# Patient Record
Sex: Female | Born: 2000 | Race: White | Hispanic: No | Marital: Single | Smoking: Never smoker
Health system: Southern US, Community
[De-identification: ages and names within clinical notes are randomized; demographics above are authoritative.]

## PROBLEM LIST (undated history)

## (undated) ENCOUNTER — Emergency Department (HOSPITAL_BASED_OUTPATIENT_CLINIC_OR_DEPARTMENT_OTHER): Disposition: A | Discharge status: Home / Self Care | Payer: No Typology Code available for payment source

## (undated) DIAGNOSIS — E559 Vitamin D deficiency, unspecified: Secondary | ICD-10-CM

## (undated) DIAGNOSIS — Z62810 Personal history of physical and sexual abuse in childhood: Secondary | ICD-10-CM

## (undated) DIAGNOSIS — F419 Anxiety disorder, unspecified: Secondary | ICD-10-CM

## (undated) HISTORY — PX: BUNIONECTOMY: SHX129

## (undated) HISTORY — DX: Personal history of physical and sexual abuse in childhood: Z62.810

## (undated) HISTORY — PX: ADENOIDECTOMY: SUR15

## (undated) HISTORY — PX: TONSILLECTOMY AND ADENOIDECTOMY: SUR1326

## (undated) HISTORY — DX: Anxiety disorder, unspecified: F41.9

## (undated) HISTORY — DX: Vitamin D deficiency, unspecified: E55.9

---
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
## 2020-01-07 DIAGNOSIS — F411 Generalized anxiety disorder: Secondary | ICD-10-CM | POA: Diagnosis not present

## 2020-02-15 DIAGNOSIS — F411 Generalized anxiety disorder: Secondary | ICD-10-CM | POA: Diagnosis not present

## 2020-02-18 DIAGNOSIS — F419 Anxiety disorder, unspecified: Secondary | ICD-10-CM | POA: Diagnosis not present

## 2020-02-18 DIAGNOSIS — F900 Attention-deficit hyperactivity disorder, predominantly inattentive type: Secondary | ICD-10-CM | POA: Diagnosis not present

## 2020-02-23 DIAGNOSIS — F411 Generalized anxiety disorder: Secondary | ICD-10-CM | POA: Diagnosis not present

## 2020-03-14 DIAGNOSIS — F419 Anxiety disorder, unspecified: Secondary | ICD-10-CM | POA: Diagnosis not present

## 2020-03-14 DIAGNOSIS — F900 Attention-deficit hyperactivity disorder, predominantly inattentive type: Secondary | ICD-10-CM | POA: Diagnosis not present

## 2020-03-15 DIAGNOSIS — F331 Major depressive disorder, recurrent, moderate: Secondary | ICD-10-CM | POA: Diagnosis not present

## 2020-03-22 DIAGNOSIS — F4323 Adjustment disorder with mixed anxiety and depressed mood: Secondary | ICD-10-CM | POA: Diagnosis not present

## 2020-05-03 DIAGNOSIS — F331 Major depressive disorder, recurrent, moderate: Secondary | ICD-10-CM | POA: Diagnosis not present

## 2020-07-08 DIAGNOSIS — F419 Anxiety disorder, unspecified: Secondary | ICD-10-CM | POA: Diagnosis not present

## 2020-07-08 DIAGNOSIS — F900 Attention-deficit hyperactivity disorder, predominantly inattentive type: Secondary | ICD-10-CM | POA: Diagnosis not present

## 2021-07-06 DIAGNOSIS — Z419 Encounter for procedure for purposes other than remedying health state, unspecified: Secondary | ICD-10-CM | POA: Diagnosis not present

## 2021-08-06 DIAGNOSIS — Z419 Encounter for procedure for purposes other than remedying health state, unspecified: Secondary | ICD-10-CM | POA: Diagnosis not present

## 2021-09-06 DIAGNOSIS — Z419 Encounter for procedure for purposes other than remedying health state, unspecified: Secondary | ICD-10-CM | POA: Diagnosis not present

## 2021-10-04 DIAGNOSIS — Z419 Encounter for procedure for purposes other than remedying health state, unspecified: Secondary | ICD-10-CM | POA: Diagnosis not present

## 2021-11-04 DIAGNOSIS — Z419 Encounter for procedure for purposes other than remedying health state, unspecified: Secondary | ICD-10-CM | POA: Diagnosis not present

## 2021-12-04 DIAGNOSIS — Z419 Encounter for procedure for purposes other than remedying health state, unspecified: Secondary | ICD-10-CM | POA: Diagnosis not present

## 2022-01-04 DIAGNOSIS — Z419 Encounter for procedure for purposes other than remedying health state, unspecified: Secondary | ICD-10-CM | POA: Diagnosis not present

## 2022-02-03 DIAGNOSIS — Z419 Encounter for procedure for purposes other than remedying health state, unspecified: Secondary | ICD-10-CM | POA: Diagnosis not present

## 2022-03-06 DIAGNOSIS — Z419 Encounter for procedure for purposes other than remedying health state, unspecified: Secondary | ICD-10-CM | POA: Diagnosis not present

## 2022-04-06 DIAGNOSIS — Z419 Encounter for procedure for purposes other than remedying health state, unspecified: Secondary | ICD-10-CM | POA: Diagnosis not present

## 2022-04-23 ENCOUNTER — Encounter: Payer: Self-pay | Admitting: Obstetrics and Gynecology

## 2022-04-23 ENCOUNTER — Ambulatory Visit (INDEPENDENT_AMBULATORY_CARE_PROVIDER_SITE_OTHER): Payer: Medicaid Other | Admitting: Obstetrics and Gynecology

## 2022-04-23 ENCOUNTER — Other Ambulatory Visit (HOSPITAL_COMMUNITY)
Admission: RE | Admit: 2022-04-23 | Discharge: 2022-04-23 | Disposition: A | Discharge status: Home / Self Care | Payer: Medicaid Other | Source: Ambulatory Visit | Attending: Obstetrics and Gynecology | Admitting: Obstetrics and Gynecology

## 2022-04-23 VITALS — BP 133/84 | HR 91 | Ht 67.0 in | Wt 302.0 lb

## 2022-04-23 DIAGNOSIS — Z3046 Encounter for surveillance of implantable subdermal contraceptive: Secondary | ICD-10-CM | POA: Diagnosis not present

## 2022-04-23 DIAGNOSIS — Z01419 Encounter for gynecological examination (general) (routine) without abnormal findings: Secondary | ICD-10-CM | POA: Diagnosis not present

## 2022-04-23 DIAGNOSIS — Z6841 Body Mass Index (BMI) 40.0 and over, adult: Secondary | ICD-10-CM | POA: Diagnosis not present

## 2022-04-23 DIAGNOSIS — Z30430 Encounter for insertion of intrauterine contraceptive device: Secondary | ICD-10-CM | POA: Diagnosis not present

## 2022-04-23 MED ORDER — LEVONORGESTREL 20 MCG/DAY IU IUD
1.0000 | INTRAUTERINE_SYSTEM | Freq: Once | INTRAUTERINE | Status: AC
  Administered 2022-04-23: 1 via INTRAUTERINE

## 2022-04-23 NOTE — Progress Notes (Signed)
Pt in office for Nexplanon removal- has had in place for 4 years.
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
## 2022-04-24 LAB — CERVICOVAGINAL ANCILLARY ONLY
Chlamydia: POSITIVE — AB
Comment: NEGATIVE
Comment: NORMAL
Neisseria Gonorrhea: NEGATIVE

## 2022-04-24 MED ORDER — DOXYCYCLINE HYCLATE 100 MG PO CAPS: 100.0000 mg | ORAL_CAPSULE | Freq: Two times a day (BID) | ORAL | 0 refills | Status: AC

## 2022-04-24 NOTE — Progress Notes (Signed)
Patient informed of results and that treatment has been sent to the pharmacy. Patient advised to inform partner of positive results and the need for treatment at pcp or health department. Patient also advised to abstain from sexual intercourse for at least 7 days after treatment. Patient verbalized understanding. [facility] form completed and faxed.

## 2022-04-24 NOTE — Addendum Note (Signed)
Addended by: Mora Bellman on: 04/24/2022 02:46 PM   Modules accepted: Orders

## 2022-04-25 ENCOUNTER — Other Ambulatory Visit: Payer: Medicaid Other

## 2022-04-25 DIAGNOSIS — Z202 Contact with and (suspected) exposure to infections with a predominantly sexual mode of transmission: Secondary | ICD-10-CM

## 2022-04-25 DIAGNOSIS — R1030 Lower abdominal pain, unspecified: Secondary | ICD-10-CM

## 2022-04-25 MED ORDER — IBUPROFEN 600 MG PO TABS: 600.0000 mg | ORAL_TABLET | Freq: Four times a day (QID) | ORAL | 1 refills | Status: AC | PRN

## 2022-04-26 LAB — HIV ANTIBODY (ROUTINE TESTING W REFLEX): HIV Screen 4th Generation wRfx: NONREACTIVE

## 2022-04-26 LAB — HEPATITIS B SURFACE ANTIGEN: Hepatitis B Surface Ag: NEGATIVE

## 2022-04-26 LAB — RPR: RPR Ser Ql: NONREACTIVE

## 2022-04-26 LAB — HEPATITIS C ANTIBODY: Hep C Virus Ab: NONREACTIVE

## 2022-04-27 LAB — CYTOLOGY - PAP
Comment: NEGATIVE
Diagnosis: UNDETERMINED — AB
High risk HPV: NEGATIVE

## 2022-05-03 ENCOUNTER — Other Ambulatory Visit: Payer: Medicaid Other

## 2022-05-06 DIAGNOSIS — Z419 Encounter for procedure for purposes other than remedying health state, unspecified: Secondary | ICD-10-CM | POA: Diagnosis not present

## 2022-05-21 ENCOUNTER — Ambulatory Visit: Payer: Medicaid Other | Admitting: Obstetrics and Gynecology

## 2022-06-06 DIAGNOSIS — Z419 Encounter for procedure for purposes other than remedying health state, unspecified: Secondary | ICD-10-CM | POA: Diagnosis not present

## 2022-07-06 DIAGNOSIS — Z419 Encounter for procedure for purposes other than remedying health state, unspecified: Secondary | ICD-10-CM | POA: Diagnosis not present

## 2022-08-06 DIAGNOSIS — Z419 Encounter for procedure for purposes other than remedying health state, unspecified: Secondary | ICD-10-CM | POA: Diagnosis not present

## 2022-09-06 DIAGNOSIS — Z419 Encounter for procedure for purposes other than remedying health state, unspecified: Secondary | ICD-10-CM | POA: Diagnosis not present

## 2022-10-05 DIAGNOSIS — Z419 Encounter for procedure for purposes other than remedying health state, unspecified: Secondary | ICD-10-CM | POA: Diagnosis not present

## 2022-11-05 DIAGNOSIS — Z419 Encounter for procedure for purposes other than remedying health state, unspecified: Secondary | ICD-10-CM | POA: Diagnosis not present

## 2022-12-05 DIAGNOSIS — Z419 Encounter for procedure for purposes other than remedying health state, unspecified: Secondary | ICD-10-CM | POA: Diagnosis not present

## 2023-01-05 DIAGNOSIS — Z419 Encounter for procedure for purposes other than remedying health state, unspecified: Secondary | ICD-10-CM | POA: Diagnosis not present

## 2023-02-04 DIAGNOSIS — Z419 Encounter for procedure for purposes other than remedying health state, unspecified: Secondary | ICD-10-CM | POA: Diagnosis not present

## 2023-03-05 ENCOUNTER — Other Ambulatory Visit: Payer: Self-pay

## 2023-03-05 ENCOUNTER — Emergency Department (HOSPITAL_COMMUNITY)
Admission: EM | Admit: 2023-03-05 | Discharge: 2023-03-05 | Discharge status: Against Medical Advice | Payer: Medicaid Other | Attending: Student | Admitting: Student

## 2023-03-05 ENCOUNTER — Emergency Department (HOSPITAL_COMMUNITY): Payer: Medicaid Other

## 2023-03-05 DIAGNOSIS — N898 Other specified noninflammatory disorders of vagina: Secondary | ICD-10-CM | POA: Insufficient documentation

## 2023-03-05 DIAGNOSIS — Z5321 Procedure and treatment not carried out due to patient leaving prior to being seen by health care provider: Secondary | ICD-10-CM | POA: Insufficient documentation

## 2023-03-05 DIAGNOSIS — R197 Diarrhea, unspecified: Secondary | ICD-10-CM | POA: Diagnosis not present

## 2023-03-05 DIAGNOSIS — R102 Pelvic and perineal pain: Secondary | ICD-10-CM | POA: Diagnosis not present

## 2023-03-05 DIAGNOSIS — R1030 Lower abdominal pain, unspecified: Secondary | ICD-10-CM | POA: Diagnosis not present

## 2023-03-05 LAB — URINALYSIS, ROUTINE W REFLEX MICROSCOPIC
Bilirubin Urine: NEGATIVE
Glucose, UA: NEGATIVE mg/dL
Ketones, ur: NEGATIVE mg/dL
Leukocytes,Ua: NEGATIVE
Nitrite: NEGATIVE
Protein, ur: NEGATIVE mg/dL
Specific Gravity, Urine: 1.021 (ref 1.005–1.030)
pH: 5 (ref 5.0–8.0)

## 2023-03-05 LAB — COMPREHENSIVE METABOLIC PANEL
ALT: 23 U/L (ref 0–44)
AST: 17 U/L (ref 15–41)
Albumin: 4.3 g/dL (ref 3.5–5.0)
Alkaline Phosphatase: 66 U/L (ref 38–126)
Anion gap: 12 (ref 5–15)
BUN: 11 mg/dL (ref 6–20)
CO2: 23 mmol/L (ref 22–32)
Calcium: 10.1 mg/dL (ref 8.9–10.3)
Chloride: 104 mmol/L (ref 98–111)
Creatinine, Ser: 0.71 mg/dL (ref 0.44–1.00)
GFR, Estimated: >60 mL/min (ref >60)
Glucose, Bld: 74 mg/dL (ref 70–99)
Potassium: 3.1 mmol/L — ABNORMAL LOW (ref 3.5–5.1)
Sodium: 139 mmol/L (ref 135–145)
Total Bilirubin: 0.6 mg/dL (ref 0.3–1.2)
Total Protein: 8.6 g/dL — ABNORMAL HIGH (ref 6.5–8.1)

## 2023-03-05 LAB — CBC WITH DIFFERENTIAL/PLATELET
Abs Immature Granulocytes: 0.02 10*3/uL (ref 0.00–0.07)
Basophils Absolute: 0 10*3/uL (ref 0.0–0.1)
Basophils Relative: 0 %
Eosinophils Absolute: 0.1 10*3/uL (ref 0.0–0.5)
Eosinophils Relative: 1 %
HCT: 44.6 % (ref 36.0–46.0)
Hemoglobin: 14.2 g/dL (ref 12.0–15.0)
Immature Granulocytes: 0 %
Lymphocytes Relative: 30 %
Lymphs Abs: 3.4 10*3/uL (ref 0.7–4.0)
MCH: 26.8 pg (ref 26.0–34.0)
MCHC: 31.8 g/dL (ref 30.0–36.0)
MCV: 84.3 fL (ref 80.0–100.0)
Monocytes Absolute: 0.6 10*3/uL (ref 0.1–1.0)
Monocytes Relative: 6 %
Neutro Abs: 7.2 10*3/uL (ref 1.7–7.7)
Neutrophils Relative %: 63 %
Platelets: 395 10*3/uL (ref 150–400)
RBC: 5.29 MIL/uL — ABNORMAL HIGH (ref 3.87–5.11)
RDW: 12.6 % (ref 11.5–15.5)
WBC: 11.4 10*3/uL — ABNORMAL HIGH (ref 4.0–10.5)
nRBC: 0 % (ref 0.0–0.2)

## 2023-03-05 LAB — HCG, SERUM, QUALITATIVE: Preg, Serum: NEGATIVE

## 2023-03-05 LAB — LIPASE, BLOOD: Lipase: 32 U/L (ref 11–51)

## 2023-03-05 NOTE — ED Triage Notes (Signed)
Pt reports left lower abd pain x 1 week

## 2023-03-05 NOTE — ED Notes (Signed)
Pt called for in ED lobby for room assignment. No answer x1. [employer]

## 2023-03-05 NOTE — ED Provider Triage Note (Signed)
Emergency Medicine Provider Triage Evaluation Note  [name] , a 22 y.o. female  was evaluated in triage.  Pt complains of bilateral pelvic pain x6 days. No concern for STDs. Admits to vaginal discharge which is normal for patient. Also admits to diarrhea. No vomiting.   Review of Systems  Positive: Pelvic pain Negative: fever  Physical Exam  BP 137/77 (BP Location: Left Arm)   Pulse 80   Temp 97.8 F (36.6 C) (Oral)   Resp 18   Ht 5\' 7"  (1.702 m)   Wt 127 kg   SpO2 99%   BMI 43.85 kg/m  Gen:   Awake, no distress   Resp:  Normal effort  MSK:   Moves extremities without difficulty  Other:  TTP left pelvic region  Medical Decision Making  Medically screening exam initiated at 2:26 PM.  Appropriate orders placed.  Alzina R Joiner was informed that the remainder of the evaluation will be completed by another provider, this initial triage assessment does not replace that evaluation, and the importance of remaining in the ED until their evaluation is complete.  Pelvic US Routine labs   Mannie Stabile, [place] 03/05/23 1431

## 2023-03-07 DIAGNOSIS — Z419 Encounter for procedure for purposes other than remedying health state, unspecified: Secondary | ICD-10-CM | POA: Diagnosis not present

## 2023-04-07 DIAGNOSIS — Z419 Encounter for procedure for purposes other than remedying health state, unspecified: Secondary | ICD-10-CM | POA: Diagnosis not present

## 2023-05-07 DIAGNOSIS — Z419 Encounter for procedure for purposes other than remedying health state, unspecified: Secondary | ICD-10-CM | POA: Diagnosis not present

## 2023-06-07 DIAGNOSIS — Z419 Encounter for procedure for purposes other than remedying health state, unspecified: Secondary | ICD-10-CM | POA: Diagnosis not present

## 2023-07-07 DIAGNOSIS — Z419 Encounter for procedure for purposes other than remedying health state, unspecified: Secondary | ICD-10-CM | POA: Diagnosis not present

## 2023-08-07 DIAGNOSIS — Z419 Encounter for procedure for purposes other than remedying health state, unspecified: Secondary | ICD-10-CM | POA: Diagnosis not present

## 2023-09-07 DIAGNOSIS — Z419 Encounter for procedure for purposes other than remedying health state, unspecified: Secondary | ICD-10-CM | POA: Diagnosis not present

## 2023-10-05 DIAGNOSIS — Z419 Encounter for procedure for purposes other than remedying health state, unspecified: Secondary | ICD-10-CM | POA: Diagnosis not present

## 2023-11-16 DIAGNOSIS — Z419 Encounter for procedure for purposes other than remedying health state, unspecified: Secondary | ICD-10-CM | POA: Diagnosis not present

## 2023-12-16 DIAGNOSIS — Z419 Encounter for procedure for purposes other than remedying health state, unspecified: Secondary | ICD-10-CM | POA: Diagnosis not present

## 2024-01-16 DIAGNOSIS — Z419 Encounter for procedure for purposes other than remedying health state, unspecified: Secondary | ICD-10-CM | POA: Diagnosis not present

## 2024-02-15 DIAGNOSIS — Z419 Encounter for procedure for purposes other than remedying health state, unspecified: Secondary | ICD-10-CM | POA: Diagnosis not present

## 2024-03-17 DIAGNOSIS — Z419 Encounter for procedure for purposes other than remedying health state, unspecified: Secondary | ICD-10-CM | POA: Diagnosis not present

## 2024-04-17 DIAGNOSIS — Z419 Encounter for procedure for purposes other than remedying health state, unspecified: Secondary | ICD-10-CM | POA: Diagnosis not present

## 2024-06-17 DIAGNOSIS — Z419 Encounter for procedure for purposes other than remedying health state, unspecified: Secondary | ICD-10-CM | POA: Diagnosis not present
